# Patient Record
Sex: Male | Born: 1985 | Race: Black or African American | Hispanic: No | Marital: Single | Smoking: Never smoker
Health system: Southern US, Community
[De-identification: ages and names within clinical notes are randomized; demographics above are authoritative.]

## PROBLEM LIST (undated history)

## (undated) DIAGNOSIS — R011 Cardiac murmur, unspecified: Secondary | ICD-10-CM

## (undated) DIAGNOSIS — W3400XA Accidental discharge from unspecified firearms or gun, initial encounter: Secondary | ICD-10-CM

## (undated) DIAGNOSIS — R9439 Abnormal result of other cardiovascular function study: Secondary | ICD-10-CM

## (undated) DIAGNOSIS — F1210 Cannabis abuse, uncomplicated: Secondary | ICD-10-CM

## (undated) DIAGNOSIS — R55 Syncope and collapse: Secondary | ICD-10-CM

## (undated) DIAGNOSIS — I421 Obstructive hypertrophic cardiomyopathy: Secondary | ICD-10-CM

## (undated) HISTORY — DX: Abnormal result of other cardiovascular function study: R94.39

## (undated) HISTORY — DX: Obstructive hypertrophic cardiomyopathy: I42.1

---

## 2010-08-12 ENCOUNTER — Emergency Department (HOSPITAL_COMMUNITY)
Admission: EM | Admit: 2010-08-12 | Discharge: 2010-08-12 | Disposition: A | Discharge status: Home / Self Care | Payer: Self-pay | Attending: Emergency Medicine | Admitting: Emergency Medicine

## 2010-08-12 DIAGNOSIS — K089 Disorder of teeth and supporting structures, unspecified: Secondary | ICD-10-CM | POA: Insufficient documentation

## 2012-07-19 ENCOUNTER — Ambulatory Visit: Payer: Self-pay | Admitting: Family Medicine

## 2012-10-21 DIAGNOSIS — IMO0002 Reserved for concepts with insufficient information to code with codable children: Secondary | ICD-10-CM | POA: Insufficient documentation

## 2012-10-21 DIAGNOSIS — Y9389 Activity, other specified: Secondary | ICD-10-CM | POA: Insufficient documentation

## 2012-10-21 DIAGNOSIS — S6990XA Unspecified injury of unspecified wrist, hand and finger(s), initial encounter: Secondary | ICD-10-CM | POA: Insufficient documentation

## 2012-10-21 DIAGNOSIS — S99919A Unspecified injury of unspecified ankle, initial encounter: Secondary | ICD-10-CM | POA: Insufficient documentation

## 2012-10-21 DIAGNOSIS — S59909A Unspecified injury of unspecified elbow, initial encounter: Secondary | ICD-10-CM | POA: Insufficient documentation

## 2012-10-21 DIAGNOSIS — Y92410 Unspecified street and highway as the place of occurrence of the external cause: Secondary | ICD-10-CM | POA: Insufficient documentation

## 2012-10-21 DIAGNOSIS — S8990XA Unspecified injury of unspecified lower leg, initial encounter: Secondary | ICD-10-CM | POA: Insufficient documentation

## 2012-10-22 ENCOUNTER — Emergency Department (HOSPITAL_COMMUNITY)
Admission: EM | Admit: 2012-10-22 | Discharge: 2012-10-22 | Disposition: A | Discharge status: Home / Self Care | Payer: Medicaid Other | Attending: Emergency Medicine | Admitting: Emergency Medicine

## 2012-10-22 ENCOUNTER — Emergency Department (HOSPITAL_COMMUNITY): Payer: Medicaid Other

## 2012-10-22 ENCOUNTER — Encounter (HOSPITAL_COMMUNITY): Payer: Self-pay
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
## 2012-12-11 ENCOUNTER — Ambulatory Visit: Payer: Self-pay | Admitting: Family Medicine

## 2015-09-14 ENCOUNTER — Emergency Department (HOSPITAL_COMMUNITY)
Admission: EM | Admit: 2015-09-14 | Discharge: 2015-09-15 | Discharge status: Against Medical Advice | Payer: Medicaid Other | Source: Home / Self Care

## 2015-09-14 NOTE — ED Notes (Signed)
Pt chose to leave before triage. Pt encouraged to stay, pt still chose to leave.

## 2017-08-17 ENCOUNTER — Observation Stay: Payer: Medicaid Other

## 2017-08-17 ENCOUNTER — Other Ambulatory Visit: Payer: Self-pay

## 2017-08-17 ENCOUNTER — Inpatient Hospital Stay
Admission: EM | Admit: 2017-08-17 | Discharge: 2017-08-18 | DRG: 312 | Disposition: A | Discharge status: Home / Self Care | Payer: Medicaid Other | Attending: Family Medicine | Admitting: Family Medicine

## 2017-08-17 ENCOUNTER — Observation Stay (HOSPITAL_BASED_OUTPATIENT_CLINIC_OR_DEPARTMENT_OTHER)
Admit: 2017-08-17 | Discharge: 2017-08-17 | Disposition: A | Discharge status: Home / Self Care | Payer: Medicaid Other | Attending: Internal Medicine | Admitting: Internal Medicine

## 2017-08-17 ENCOUNTER — Emergency Department: Payer: Medicaid Other

## 2017-08-17 ENCOUNTER — Encounter: Payer: Self-pay | Admitting: Emergency Medicine

## 2017-08-17 DIAGNOSIS — I421 Obstructive hypertrophic cardiomyopathy: Secondary | ICD-10-CM | POA: Diagnosis present

## 2017-08-17 DIAGNOSIS — F1210 Cannabis abuse, uncomplicated: Secondary | ICD-10-CM | POA: Diagnosis present

## 2017-08-17 DIAGNOSIS — R55 Syncope and collapse: Secondary | ICD-10-CM

## 2017-08-17 DIAGNOSIS — R778 Other specified abnormalities of plasma proteins: Secondary | ICD-10-CM

## 2017-08-17 DIAGNOSIS — R7989 Other specified abnormal findings of blood chemistry: Secondary | ICD-10-CM

## 2017-08-17 DIAGNOSIS — R011 Cardiac murmur, unspecified: Secondary | ICD-10-CM | POA: Diagnosis present

## 2017-08-17 DIAGNOSIS — E876 Hypokalemia: Secondary | ICD-10-CM | POA: Diagnosis present

## 2017-08-17 HISTORY — DX: Cannabis abuse, uncomplicated: F12.10

## 2017-08-17 HISTORY — DX: Syncope and collapse: R55

## 2017-08-17 HISTORY — DX: Accidental discharge from unspecified firearms or gun, initial encounter: W34.00XA

## 2017-08-17 HISTORY — DX: Cardiac murmur, unspecified: R01.1

## 2017-08-17 LAB — BASIC METABOLIC PANEL
Anion gap: 6 (ref 5–15)
BUN: 13 mg/dL (ref 6–20)
CALCIUM: 9 mg/dL (ref 8.9–10.3)
CO2: 27 mmol/L (ref 22–32)
CREATININE: 1.12 mg/dL (ref 0.61–1.24)
Chloride: 104 mmol/L (ref 101–111)
GFR calc Af Amer: >60 mL/min (ref >60)
GFR calc non Af Amer: >60 mL/min (ref >60)
GLUCOSE: 96 mg/dL (ref 65–99)
Potassium: 3.3 mmol/L — ABNORMAL LOW (ref 3.5–5.1)
SODIUM: 137 mmol/L (ref 135–145)

## 2017-08-17 LAB — FIBRIN DERIVATIVES D-DIMER (ARMC ONLY): Fibrin derivatives D-dimer (ARMC): 205.77 ng/mL (FEU) (ref 0.00–499.00)

## 2017-08-17 LAB — ECHOCARDIOGRAM COMPLETE
Height: 72 in
Weight: 2832 oz

## 2017-08-17 LAB — CBC
HCT: 39 % — ABNORMAL LOW (ref 40.0–52.0)
Hemoglobin: 13.5 g/dL (ref 13.0–18.0)
MCH: 29.3 pg (ref 26.0–34.0)
MCHC: 34.5 g/dL (ref 32.0–36.0)
MCV: 85 fL (ref 80.0–100.0)
PLATELETS: 160 10*3/uL (ref 150–440)
RBC: 4.59 MIL/uL (ref 4.40–5.90)
RDW: 13.3 % (ref 11.5–14.5)
WBC: 7.2 10*3/uL (ref 3.8–10.6)

## 2017-08-17 LAB — TROPONIN I
TROPONIN I: 0.04 ng/mL — AB (ref <0.03)
TROPONIN I: 0.04 ng/mL — AB (ref <0.03)
Troponin I: 0.05 ng/mL (ref <0.03)

## 2017-08-17 MED ORDER — POTASSIUM CHLORIDE CRYS ER 10 MEQ PO TBCR
30.0000 meq | EXTENDED_RELEASE_TABLET | Freq: Once | ORAL | Status: AC
  Administered 2017-08-17: 30 meq via ORAL
  Filled 2017-08-17: qty 3

## 2017-08-17 MED ORDER — ADULT MULTIVITAMIN W/MINERALS CH
1.0000 | ORAL_TABLET | Freq: Every day | ORAL | Status: DC
  Administered 2017-08-17 – 2017-08-18 (×2): 1 via ORAL
  Filled 2017-08-17 (×2): qty 1

## 2017-08-17 MED ORDER — ONDANSETRON HCL 4 MG PO TABS: 4.0000 mg | ORAL_TABLET | Freq: Four times a day (QID) | ORAL | Status: DC | PRN

## 2017-08-17 MED ORDER — ONDANSETRON HCL 4 MG/2ML IJ SOLN: 4.0000 mg | Freq: Four times a day (QID) | INTRAMUSCULAR | Status: DC | PRN

## 2017-08-17 MED ORDER — SENNOSIDES-DOCUSATE SODIUM 8.6-50 MG PO TABS: 1.0000 | ORAL_TABLET | Freq: Every evening | ORAL | Status: DC | PRN

## 2017-08-17 MED ORDER — ENOXAPARIN SODIUM 40 MG/0.4ML ~~LOC~~ SOLN: 40.0000 mg | SUBCUTANEOUS | Status: DC

## 2017-08-17 MED ORDER — ACETAMINOPHEN 325 MG PO TABS: 650.0000 mg | ORAL_TABLET | Freq: Four times a day (QID) | ORAL | Status: DC | PRN

## 2017-08-17 MED ORDER — BOOST / RESOURCE BREEZE PO LIQD CUSTOM
1.0000 | Freq: Three times a day (TID) | ORAL | Status: DC
  Administered 2017-08-17 – 2017-08-18 (×3): 1 via ORAL

## 2017-08-17 MED ORDER — SODIUM CHLORIDE 0.9% FLUSH
3.0000 mL | Freq: Two times a day (BID) | INTRAVENOUS | Status: DC
  Administered 2017-08-17 (×2): 3 mL via INTRAVENOUS

## 2017-08-17 MED ORDER — METOPROLOL TARTRATE 25 MG PO TABS
12.5000 mg | ORAL_TABLET | Freq: Two times a day (BID) | ORAL | Status: DC
  Administered 2017-08-17 – 2017-08-18 (×3): 12.5 mg via ORAL
  Filled 2017-08-17 (×3): qty 1

## 2017-08-17 MED ORDER — NITROGLYCERIN 0.4 MG SL SUBL: 0.4000 mg | SUBLINGUAL_TABLET | SUBLINGUAL | Status: DC | PRN

## 2017-08-17 MED ORDER — ACETAMINOPHEN 650 MG RE SUPP: 650.0000 mg | Freq: Four times a day (QID) | RECTAL | Status: DC | PRN

## 2017-08-17 NOTE — ED Triage Notes (Signed)
Patient ambulatory to triage with steady gait, without difficulty or distress noted; pt reports [facility] "all the time"; denies any accomp symptoms, denies any recent illness

## 2017-08-17 NOTE — Plan of Care (Signed)
  Problem: Health Behavior/Discharge Planning: Goal: Ability to manage health-related needs will improve Outcome: Progressing   Problem: Clinical Measurements: Goal: Ability to maintain clinical measurements within normal limits will improve Outcome: Progressing Goal: Diagnostic test results will improve Outcome: Progressing Goal: Cardiovascular complication will be avoided Outcome: Progressing   Problem: Activity: Goal: Risk for activity intolerance will decrease Outcome: Progressing   

## 2017-08-17 NOTE — H&P (Signed)
[facility] -  at [facility]   PATIENT NAME: Kert Shackett    MR#:  [id number]  DATE OF BIRTH:  [birth year]  DATE OF ADMISSION:  08/17/2017  PRIMARY CARE PHYSICIAN: Patient, No Pcp Per   REQUESTING/REFERRING PHYSICIAN:  Merrily Brittle, MD   CHIEF COMPLAINT:  No chief complaint on file.  CC: Exertional syncope.  HISTORY OF PRESENT ILLNESS:  Yahir Tavano  is a 32 y.o. male with no known chronic medical conditions/PMHx who p/w exertional syncope. Pt states that the first time he had a syncopal episode was in 2009, at the age of 32 years old. He states he was playing basketball, and then took a break to run to his car to get water. He grabbed the handle to the car to open the door, and woke up on the ground next to the car. He states that he will typically experience diaphoresis, SOB and hyperventilation, tunnel vision, vision disturbance/blackout and transient hyperacusis in association with/preceding syncopal attacks. He states he is more likely to have a syncopal attack when it is hot outside or if he is breathing hot air, and breathing cold air helps to alleviate his symptoms. Since his initial episode, he states he has had exertional near-syncope and syncope on a regular ongoing basis, with episodes sometimes occurring daily, almost always associated with any activity that causes an increase in heart rate (though, interestingly, he notes he has never had a syncopal episode due to sex, which he states typically increases his heart rate significantly).  Mr. Odenthal tells me he recently started a new job ~2wks ago. 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 He states the work is moderately strenuous, as it requires lifting the motors, which weigh ~60lb by his estimation. He works the night shift, and starts his shift at 2100PM. He states he went to work on Tuesday  (08/16/2017) evening, and worked from [employer] w/o any major issue, though he states he began to feel short of breath around that time. He took a ten-minute break, and then resumed his responsibilities at 2320PM. Between 2320PM and midnight, he states his symptoms started to get worse. He characterizes his symptoms as the aforementioned prodrome that typically precedes his syncopal attacks (diaphoresis, SOB, hyperventilation, tunnel vision, hyperacusis). He did not lose consciousness though. He told his boss that he was not feeling well, and drove himself to the hospital. The pt endorses the aforementioned symptoms, as well as the sensation of occasional ectopic heartbeats, but he denies any other symptoms. (-) F/C/N/V/D/AP, CP, palpitations, night sweats, rigors, HA/blurred vision, vertigo,  urinary symptoms. He is young, otherwise healthy and well-appearing, though he does note that he has avoided exercise and strenuous activity for much of his life, as a result of his symptoms. He has not seen a PCP in 43yrs. He denies family history of sudden cardiac death.  PAST MEDICAL HISTORY:  History reviewed. No pertinent past medical history.  PAST SURGICAL HISTORY:  History reviewed. No pertinent surgical history.  SOCIAL HISTORY:   Social History   Tobacco Use  . Smoking status: Never Smoker  . Smokeless tobacco: Never Used  Substance Use Topics  . Alcohol use: Yes   (-) tobacco/EtOH use. (+) daily marijuana use. (-) cocaine/heroin use. Lives in house w/ significant other, 4 sons. Works for [employer].  FAMILY HISTORY:  History reviewed. No pertinent family history.  Mother alive & healthy @ age 34. Father deceased @ age 34, 2/2 ALS. 4 brothers (1 w/ DM, 3 healthy). 3 sisters (1 deceased, 2/2 Sz; 2 alive & healthy). 4 sons (1 w/ tympanostomy tubes; 1 w/ cryptorchidism).  DRUG ALLERGIES:  No Known Allergies  REVIEW OF SYSTEMS:   Review of Systems  Constitutional: Negative for chills,  diaphoresis, fever, malaise/fatigue and weight loss.  HENT: Negative for congestion, hearing loss, sinus pain, sore throat and tinnitus.   Eyes: Negative for blurred vision, double vision and photophobia.  Respiratory: Positive for shortness of breath. Negative for cough, hemoptysis and sputum production.   Cardiovascular: Negative for chest pain, palpitations, orthopnea, claudication, leg swelling and PND.  Gastrointestinal: Negative for abdominal pain, blood in stool, constipation, diarrhea, heartburn, melena, nausea and vomiting.  Genitourinary: Negative for dysuria, frequency, hematuria and urgency.  Musculoskeletal: Negative for back pain, joint pain, myalgias and neck pain.  Skin: Negative for itching and rash.  Neurological: Positive for dizziness, sensory change and loss of consciousness. Negative for tingling, tremors, speech change, focal weakness, seizures, weakness and headaches.    MEDICATIONS AT HOME:   Prior to Admission medications   Not on File      VITAL SIGNS:  Blood pressure 131/72, pulse (!) 59, temperature 98 F (36.7 C), temperature source Oral, resp. rate 14, height 6' (1.829 m), weight 85.7 kg (189 lb), SpO2 99 %.  PHYSICAL EXAMINATION:  Physical Exam  Constitutional: He is oriented to person, place, and time. He appears well-developed and well-nourished. He is active and cooperative. He is easily aroused. No distress.  HENT:  Head: Normocephalic and atraumatic.  Mouth/Throat: No oropharyngeal exudate.  Eyes: Pupils are equal, round, and reactive to light. EOM are normal. No scleral icterus.  Neck: Normal range of motion. Neck supple. No JVD present. No thyromegaly present.  Cardiovascular: Normal rate, regular rhythm, S1 normal and S2 normal.  No extrasystoles are present. Exam reveals no gallop, no S3, no S4, no distant heart sounds and no friction rub.  Murmur heard.  Systolic murmur is present with a grade of 4/6. Pulmonary/Chest: Effort normal and  breath sounds normal. No stridor. No respiratory distress. He has no decreased breath sounds. He has no wheezes. He has no rhonchi. He has no rales.  Abdominal: Soft. Bowel sounds are normal. He exhibits no distension. There is no tenderness. There is no rebound and no guarding.  Musculoskeletal: Normal range of motion. He exhibits no edema.  Lymphadenopathy:    He has no cervical adenopathy.  Neurological: He is alert, oriented to person, place, and time and easily aroused. He is not disoriented. He displays normal reflexes. No cranial nerve deficit or sensory deficit. He exhibits normal muscle tone. Coordination normal.  Skin: Skin is warm, dry and intact. No rash noted. He is not diaphoretic. No erythema.  Psychiatric: He has a normal mood and affect. His behavior is normal. Judgment and thought content normal.    LABORATORY PANEL:   CBC Recent Labs  Lab 08/17/17 0059  WBC 7.2  HGB 13.5  HCT 39.0*  PLT 160   ------------------------------------------------------------------------------------------------------------------  Chemistries  Recent Labs  Lab 08/17/17 0059  NA 137  K 3.3*  CL 104  CO2 27  GLUCOSE 96  BUN 13  CREATININE 1.12  CALCIUM 9.0   ------------------------------------------------------------------------------------------------------------------  Cardiac Enzymes Recent Labs  Lab 08/17/17 0059  TROPONINI 0.04*   ------------------------------------------------------------------------------------------------------------------  RADIOLOGY:  Dg Chest 2 View  Result Date: 08/17/2017 CLINICAL DATA:  Short of  breath EXAM: CHEST - 2 VIEW COMPARISON:  None. FINDINGS: The heart size and mediastinal contours are within normal limits. Both lungs are clear. The visualized skeletal structures are unremarkable. IMPRESSION: No active cardiopulmonary disease. Electronically Signed   By: Jasmine Pang M.D.   On: 08/17/2017 01:20   IMPRESSION AND PLAN:   A/P: 70M  exertional syncope/systolic murmur.  1.) Exertional syncope/systolic murmur: Pt p/w 42yr Hx exertional syncope. He recently started a new job, and had to leave work and come to the hospital early on Wednesday (08/17/2017) morning due to symptoms. Cardiac examination is (+) 4/6 systolic murmur. EKG does not demonstrate acute ST segment changes, and shows TWI in I, II, aVL, V5-V6, which seem to be stable changes. CXR (-). Trop-I unimpressive. 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 Pt ordered for Echocardiogram. Cardiology consult. We did briefly discuss myomectomy, ablation and conservative management of HOCM, just for fun, but we concluded with the agreement that diagnosis should be confirmed by testing prior to comprehensive discussion of management, and further recommendations would ideally be provided by one of our venerable expert Cardiologists. Tele, cardiac monitoring.  2.) Hypokalemia: K+ 3.3, mild. Replete, monitor.  3.) FEN/GI: Regular diet.  4.) DVT PPx: Lovenox 40mg  SQ qD.  5.) Code status: Full code.  6.) Disposition: Observation, pt expected to stay < 2 midnights.  All the records are reviewed and case discussed with ED provider. Management plans discussed with the patient, family and they are in agreement.  CODE STATUS: Full code.  TOTAL TIME TAKING CARE OF THIS PATIENT: 75 minutes.    Barbaraann Rondo M.D on 08/17/2017 at 4:51 AM  Between 7am to 6pm - Pager - [phone]  After 6pm go to [email or web address] - Social research officer, government  [facility]  Office  [phone]  CC: Primary care physician; Patient, No Pcp Per   Note: This dictation was prepared with Dragon dictation along with smaller phrase technology. Any transcriptional errors that result from this process are unintentional.

## 2017-08-17 NOTE — ED Provider Notes (Signed)
[facility] Emergency Department Provider Note  ____________________________________________   First MD Initiated Contact with Patient 08/17/17 0245     (approximate)  I have reviewed the triage vital signs and the nursing notes.   HISTORY  Chief Complaint No chief complaint on file.   HPI Derek Price is a 32 y.o. male who self presents to the emergency department with exertional syncope.  He has no past medical history and takes no medications.  He has no family history of sudden cardiac death.  He says that for the past month or so when he is exerting himself he is gotten "out of breath" and has passed out multiple times.  He did not actually pass out completely today but while at work he nearly did which prompted the visit.  He denies palpitations.  He denies chest pain.  His symptoms are intermittent sudden onset.  They are improved with rest.  He does report smoking marijuana but denies other drug use.  History reviewed. No pertinent past medical history.  There are no active problems to display for this patient.   History reviewed. No pertinent surgical history.  Prior to Admission medications   Not on File    Allergies Patient has no known allergies.  No family history on file.  Social History Social History   Tobacco Use  . Smoking status: Never Smoker  . Smokeless tobacco: Never Used  Substance Use Topics  . Alcohol use: Yes  . Drug use: Yes    Types: Marijuana    Review of Systems Constitutional: No fever/chills Eyes: No visual changes. ENT: No sore throat. Cardiovascular: Denies chest pain. Respiratory: Positive for shortness of breath. Gastrointestinal: No abdominal pain.  No nausea, no vomiting.  No diarrhea.  No constipation. Genitourinary: Negative for dysuria. Musculoskeletal: Negative for back pain. Skin: Negative for rash. Neurological: Negative for headaches, focal weakness or  numbness.   ____________________________________________   PHYSICAL EXAM:  VITAL SIGNS: ED Triage Vitals  Enc Vitals Group     BP 08/17/17 0049 (!) 141/73     Pulse Rate 08/17/17 0049 60     Resp 08/17/17 0049 20     Temp 08/17/17 0049 98 F (36.7 C)     Temp Source 08/17/17 0049 Oral     SpO2 08/17/17 0049 99 %     Weight 08/17/17 0048 189 lb (85.7 kg)     Height 08/17/17 0048 6' (1.829 m)     Head Circumference --      Peak Flow --      Pain Score 08/17/17 0048 0     Pain Loc --      Pain Edu? --      Excl. in GC? --     Constitutional: Alert and oriented x4 pleasant cooperative speaks in full clear sentences no diaphoresis Eyes: PERRL EOMI. Head: Atraumatic. Nose: No congestion/rhinnorhea. Mouth/Throat: No trismus Neck: No stridor.  Able to lie completely flat with no JVD Cardiovascular: Normal rate, regular rhythm.  Harsh systolic murmur best heard at right sternal border Respiratory: Normal respiratory effort.  No retractions. Lungs CTAB and moving good air Gastrointestinal: Soft nontender Musculoskeletal: No lower extremity edema legs are equal in size Neurologic:  Normal speech and language. No gross focal neurologic deficits are appreciated. Skin:  Skin is warm, dry and intact. No rash noted. Psychiatric: Mood and affect are normal. Speech and behavior are normal.    ____________________________________________   DIFFERENTIAL includes but not limited to  Cardiogenic syncope,  vasovagal syncope, dehydration, acute coronary syndrome, pulmonary embolism ____________________________________________   LABS (all labs ordered are listed, but only abnormal results are displayed)  Labs Reviewed  CBC - Abnormal; Notable for the following components:      Result Value   HCT 39.0 (*)    All other components within normal limits  BASIC METABOLIC PANEL - Abnormal; Notable for the following components:   Potassium 3.3 (*)    All other components within normal  limits  TROPONIN I - Abnormal; Notable for the following components:   Troponin I 0.04 (*)    All other components within normal limits    Lab work reviewed by me shows slightly elevated troponin which is nonspecific __________________________________________  EKG  ED ECG REPORT I, Merrily BrittleNeil Moana Munford, the attending physician, personally viewed and interpreted this ECG.  Date: 08/17/2017 EKG Time: 0054 Rate: 62 Rhythm: normal sinus rhythm QRS Axis: normal Intervals: normal ST/T Wave abnormalities: TWI leads I, II, aVL, V6 possibly secondary to LVH versus subendocardial ischemia Narrative Interpretation:possible ischemia  ED ECG REPORT I, Merrily BrittleNeil Briseida Gittings, the attending physician, personally viewed and interpreted this ECG.  Date: 08/17/2017 EKG Time: 0256 Rate: 67 Rhythm: normal sinus rhythm QRS Axis: normal Intervals: normal ST/T Wave abnormalities: T wave inversion in lead I, aVL, aVF, V5 V6 similar to previous Narrative Interpretation: no evidence of acute ischemia  ____________________________________________  RADIOLOGY  Chest x-ray reviewed by me with no acute disease ____________________________________________   PROCEDURES  Procedure(s) performed: no  Procedures  Critical Care performed: no  Observation: no ____________________________________________   INITIAL IMPRESSION / ASSESSMENT AND PLAN / ED COURSE  Pertinent labs & imaging results that were available during my care of the patient were reviewed by me and considered in my medical decision making (see chart for details).       ----------------------------------------- 2:57 AM on 08/17/2017 -----------------------------------------  The patient has had increasing frequency of exertional syncope along with a harsh systolic murmur concerning for aortic stenosis versus HOCM.  At this point the patient requires inpatient admission for continued telemetry to evaluate for malignant arrhythmia as well  as echocardiogram.  The patient verbalizes understanding and agreement with the plan.  I then discussed with the hospitalist who has graciously agreed to admit the patient to his service. ____________________________________________   FINAL CLINICAL IMPRESSION(S) / ED DIAGNOSES  Final diagnoses:  None      NEW MEDICATIONS STARTED DURING THIS VISIT:  New Prescriptions   No medications on file     Note:  This document was prepared using Dragon voice recognition software and may include unintentional dictation errors.     Merrily Brittleifenbark, Cheyenne Schumm, MD 08/17/17 [phone]
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
## 2017-08-17 NOTE — Progress Notes (Signed)
To ultrasound via bed 

## 2017-08-17 NOTE — Progress Notes (Signed)
Back from ultrasound

## 2017-08-17 NOTE — Progress Notes (Addendum)
Initial Nutrition Assessment  DOCUMENTATION CODES:   Not applicable  INTERVENTION:   Boost Breeze po TID, each supplement provides 250 kcal and 9 grams of protein  MVI daily  NUTRITION DIAGNOSIS:   Inadequate oral intake related to social / environmental circumstances(marijuna abuse ) as evidenced by per patient/family report.  GOAL:   Patient will meet greater than or equal to 90% of their needs  MONITOR:   PO intake, Supplement acceptance, Labs, Weight trends, I & O's  REASON FOR ASSESSMENT:   Malnutrition Screening Tool    ASSESSMENT:   32 year old man with history of recurrent syncope and near syncope in the setting of long-standing heart murmur, whom we have been asked to see due to near syncope.   Met with pt in room today. Pt reports poor appetite and oral intake for several months that he relates to a chronic poor appetite. Pt reports that he smokes marijuana daily and eats one meal a day. Pt states that he works third shift and eats one meal a day before he goes to work and then goes straight to bed once he gets off. Pt reports recent wt loss; pt states that he has never weighed less than 190lbs but he is currently 177lbs which was taken on a standing scale. Pt reports the last time he remembers weighing over 190lbs was 6 months ago. Pt does not like "milky supplements" but he does like Boost Breeze (mixed berry or orange). RD discussed with pt ways to increase calorie and protein intake during the day and recommended daily supplements. Also recommended daily MVI as long as his intake is poor. Pt at moderate refeeding risk and complaining of muscle weakness. Would recommend check Mg and P labs. Pt eating 30-70% of meals in hospital.     Medications reviewed and include: lovenox  Labs reviewed: K 3.3(L)  NUTRITION - FOCUSED PHYSICAL EXAM:    Most Recent Value  Orbital Region  No depletion  Upper Arm Region  No depletion  Thoracic and Lumbar Region  No depletion   Buccal Region  No depletion  Temple Region  No depletion  Clavicle Bone Region  No depletion  Clavicle and Acromion Bone Region  No depletion  Scapular Bone Region  No depletion  Dorsal Hand  No depletion  Patellar Region  Mild depletion  Anterior Thigh Region  Mild depletion  Posterior Calf Region  Mild depletion  Edema (RD Assessment)  None  Hair  Reviewed  Eyes  Reviewed  Mouth  Reviewed  Skin  Reviewed  Nails  Reviewed     Diet Order:  Diet regular Room service appropriate? Yes; Fluid consistency: Thin  EDUCATION NEEDS:   Education needs have been addressed  Skin:  Skin Assessment: Reviewed RN Assessment  Last BM:  4/17  Height:   Ht Readings from Last 1 Encounters:  08/17/17 6' (1.829 m)    Weight:   Wt Readings from Last 1 Encounters:  08/17/17 177 lb (80.3 kg)    Ideal Body Weight:  80.9 kg  BMI:  Body mass index is 24.01 kg/m.  Estimated Nutritional Needs:   Kcal:  2100-2400kcal/day   Protein:  89-104g/day   Fluid:  >2.2L/day  Koleen Distance MS, RD, LDN Pager #[phone] After Hours Pager: [phone]

## 2017-08-17 NOTE — ED Notes (Signed)
Pt provided with warm blankets and admission process explained to pt who verbalizes understanding.

## 2017-08-17 NOTE — Plan of Care (Signed)
  Problem: Clinical Measurements: Goal: Will remain free from infection Outcome: Progressing Note:  Remains afebrile    Problem: Pain Managment: Goal: General experience of comfort will improve Outcome: Progressing Note:  PRN medications   Problem: Safety: Goal: Ability to remain free from injury will improve Outcome: Progressing Note:  Fall precautions in place, non skid socks when oob   Problem: Clinical Measurements: Goal: Diagnostic test results will improve Outcome: Not Progressing Note:  K 3.3 this am, replacing

## 2017-08-17 NOTE — Progress Notes (Signed)
*  PRELIMINARY RESULTS* Echocardiogram 2D Echocardiogram has been performed.  Derek Price, Derek Price 08/17/2017, 2:10 PM

## 2017-08-17 NOTE — ED Notes (Signed)
Patient transported to 235 

## 2017-08-17 NOTE — Progress Notes (Addendum)
[facility] [place] [facility]   PATIENT NAME: Derek Price    MR#:  [id number]  DATE OF BIRTH:  [birth year]  SUBJECTIVE:  CHIEF COMPLAINT:  No chief complaint on file. Patient without complaint, no events overnight  REVIEW OF SYSTEMS:  CONSTITUTIONAL: No fever, fatigue or weakness.  EYES: No blurred or double vision.  EARS, NOSE, AND THROAT: No tinnitus or ear pain.  RESPIRATORY: No cough, shortness of breath, wheezing or hemoptysis.  CARDIOVASCULAR: No chest pain, orthopnea, edema.  GASTROINTESTINAL: No nausea, vomiting, diarrhea or abdominal pain.  GENITOURINARY: No dysuria, hematuria.  ENDOCRINE: No polyuria, nocturia,  HEMATOLOGY: No anemia, easy bruising or bleeding SKIN: No rash or lesion. MUSCULOSKELETAL: No joint pain or arthritis.   NEUROLOGIC: No tingling, numbness, weakness.  PSYCHIATRY: No anxiety or depression.   ROS  DRUG ALLERGIES:  No Known Allergies  VITALS:  Blood pressure 106/74, pulse (!) 50, temperature 97.7 F (36.5 C), temperature source Oral, resp. rate 18, height 6' (1.829 m), weight 80.3 kg (177 lb), SpO2 99 %.  PHYSICAL EXAMINATION:  GENERAL:  32 y.o.-year-old patient lying in the bed with no acute distress.  EYES: Pupils equal, round, reactive to light and accommodation. No scleral icterus. Extraocular muscles intact.  HEENT: Head atraumatic, normocephalic. Oropharynx and nasopharynx clear.  NECK:  Supple, no jugular venous distention. No thyroid enlargement, no tenderness.  LUNGS: Normal breath sounds bilaterally, no wheezing, rales,rhonchi or crepitation. No use of accessory muscles of respiration.  CARDIOVASCULAR: S1, S2 normal. No murmurs, rubs, or gallops.  ABDOMEN: Soft, nontender, nondistended. Bowel sounds present. No organomegaly or mass.  EXTREMITIES: No pedal edema, cyanosis, or clubbing.  NEUROLOGIC: Cranial nerves II through XII are intact. Muscle strength 5/5 in all extremities. Sensation intact. Gait not  checked.  PSYCHIATRIC: The patient is alert and oriented x 3.  SKIN: No obvious rash, lesion, or ulcer.   Physical Exam LABORATORY PANEL:   CBC Recent Labs  Lab 08/17/17 0059  WBC 7.2  HGB 13.5  HCT 39.0*  PLT 160   ------------------------------------------------------------------------------------------------------------------  Chemistries  Recent Labs  Lab 08/17/17 0059  NA 137  K 3.3*  CL 104  CO2 27  GLUCOSE 96  BUN 13  CREATININE 1.12  CALCIUM 9.0   ------------------------------------------------------------------------------------------------------------------  Cardiac Enzymes Recent Labs  Lab 08/17/17 0059  TROPONINI 0.04*   ------------------------------------------------------------------------------------------------------------------  RADIOLOGY:  Dg Chest 2 View  Result Date: 08/17/2017 CLINICAL DATA:  Short of breath EXAM: CHEST - 2 VIEW COMPARISON:  None. FINDINGS: The heart size and mediastinal contours are within normal limits. Both lungs are clear. The visualized skeletal structures are unremarkable. IMPRESSION: No active cardiopulmonary disease. Electronically Signed   By: Jasmine PangKim  Fujinaga M.D.   On: 08/17/2017 01:20   [place] Carotid Bilateral  Result Date: 08/17/2017 CLINICAL DATA:  32 year old male with a history of syncope. Cardiovascular risk factors include none listed EXAM: BILATERAL CAROTID DUPLEX ULTRASOUND TECHNIQUE: Wallace CullensGray scale imaging, color Doppler and duplex ultrasound were performed of bilateral carotid and vertebral arteries in the neck. COMPARISON:  None. FINDINGS: Criteria: Quantification of carotid stenosis is based on velocity parameters that correlate the residual internal carotid diameter with NASCET-based stenosis levels, using the diameter of the distal internal carotid lumen as the denominator for stenosis measurement. The following velocity measurements were obtained: RIGHT ICA:  Systolic 121 cm/sec, Diastolic 28 cm/sec CCA:  193  cm/sec SYSTOLIC ICA/CCA RATIO:  0.6 ECA:  90 cm/sec LEFT ICA:  Systolic 114 cm/sec, Diastolic 50 cm/sec CCA:  185  cm/sec SYSTOLIC ICA/CCA RATIO:  0.6 ECA:  111 cm/sec Right Brachial SBP: Not acquired Left Brachial SBP: Not acquired RIGHT CAROTID ARTERY: No significant calcified disease of the right common carotid artery. Intermediate waveform maintained. Heterogeneous plaque without significant calcifications at the right carotid bifurcation. Low resistance waveform of the right ICA. No significant tortuosity. RIGHT VERTEBRAL ARTERY: Antegrade flow with low resistance waveform. LEFT CAROTID ARTERY: No significant calcified disease of the left common carotid artery. Intermediate waveform maintained. Heterogeneous plaque at the left carotid bifurcation without significant calcifications. Low resistance waveform of the left ICA. LEFT VERTEBRAL ARTERY:  Antegrade flow with low resistance waveform. IMPRESSION: Color duplex indicates minimal heterogeneous plaque, with no hemodynamically significant stenosis by duplex criteria in the extracranial cerebrovascular circulation. Signed, Yvone Neu. Loreta Ave, DO [facility] Electronically Signed   By: Gilmer Mor D.O.   On: 08/17/2017 10:08    ASSESSMENT AND PLAN:  A/P: 22M exertional syncope/systolic murmur.  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 Follow-up on echocardiogram, EKG noted for marked atrial enlargement, noted stage 5 out of 6 systolic ejection murmur on examination, cardiology input appreciated-potential cardiac MRI/EP studies for ICD/referral to tertiary center for a myomectomy versus septal ablation evaluation, carotid Dopplers were negative, orthostatics were negative  *Acute hypokalemia Repleted  *Acute on chronic illicit drug abuse Cessation counseling given  *Acute elevated troponins Acute coronary syndrome is doubted Cycle set of cardiac enzymes while in  house  *Acute abnormal EKG Noted for marked atrial enlargement  D-dimer was normal, follow-up on echocardiogram, and continue close medical monitoring, cardiology following   All the records are reviewed and case discussed with Care Management/Social Workerr. Management plans discussed with the patient, family and they are in agreement.  CODE STATUS: full  TOTAL TIME TAKING CARE OF THIS PATIENT: 35 minutes.     POSSIBLE D/C IN 1-2 DAYS, DEPENDING ON CLINICAL CONDITION.   Evelena Asa Raela Bohl M.D on 08/17/2017   Between 7am to 6pm - Pager - [phone]  After 6pm go to [email or web address] - password [email or web address]  [facility]  Office  [phone]  CC: Primary care physician; Patient, No Pcp Per  Note: This dictation was prepared with Dragon dictation along with smaller phrase technology. Any transcriptional errors that result from this process are unintentional.

## 2017-08-18 ENCOUNTER — Other Ambulatory Visit: Payer: Self-pay | Admitting: Nurse Practitioner

## 2017-08-18 DIAGNOSIS — I421 Obstructive hypertrophic cardiomyopathy: Secondary | ICD-10-CM

## 2017-08-18 DIAGNOSIS — E876 Hypokalemia: Secondary | ICD-10-CM | POA: Diagnosis present

## 2017-08-18 DIAGNOSIS — R55 Syncope and collapse: Secondary | ICD-10-CM | POA: Diagnosis not present

## 2017-08-18 DIAGNOSIS — F1210 Cannabis abuse, uncomplicated: Secondary | ICD-10-CM | POA: Diagnosis present

## 2017-08-18 DIAGNOSIS — R011 Cardiac murmur, unspecified: Secondary | ICD-10-CM | POA: Diagnosis present

## 2017-08-18 LAB — BASIC METABOLIC PANEL
Anion gap: 3 — ABNORMAL LOW (ref 5–15)
BUN: 13 mg/dL (ref 6–20)
CHLORIDE: 109 mmol/L (ref 101–111)
CO2: 26 mmol/L (ref 22–32)
Calcium: 9.1 mg/dL (ref 8.9–10.3)
Creatinine, Ser: 0.9 mg/dL (ref 0.61–1.24)
GFR calc non Af Amer: >60 mL/min (ref >60)
Glucose, Bld: 96 mg/dL (ref 65–99)
POTASSIUM: 4.1 mmol/L (ref 3.5–5.1)
SODIUM: 138 mmol/L (ref 135–145)

## 2017-08-18 LAB — GLUCOSE, CAPILLARY: Glucose-Capillary: 109 mg/dL — ABNORMAL HIGH (ref 65–99)

## 2017-08-18 LAB — HIV ANTIBODY (ROUTINE TESTING W REFLEX): HIV SCREEN 4TH GENERATION: NONREACTIVE

## 2017-08-18 LAB — PHOSPHORUS: PHOSPHORUS: 3.6 mg/dL (ref 2.5–4.6)

## 2017-08-18 LAB — TROPONIN I: TROPONIN I: 0.04 ng/mL — AB (ref <0.03)

## 2017-08-18 LAB — MAGNESIUM: MAGNESIUM: 1.8 mg/dL (ref 1.7–2.4)

## 2017-08-18 MED ORDER — METOPROLOL TARTRATE 25 MG PO TABS: 12.5000 mg | ORAL_TABLET | Freq: Two times a day (BID) | ORAL | 0 refills | Status: DC
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
## 2017-08-18 NOTE — Progress Notes (Signed)
Pt discharged to home. Heart monitor removed. Discharge instructions and education reviewed with pt. Pt ambulated to POV.

## 2017-08-18 NOTE — Progress Notes (Addendum)
Progress Note  Patient Name: Derek Price Date of Encounter: 08/18/2017  Primary Cardiologist: Yvonne Kendallhristopher End, MD  Subjective   No chest pain, sob, palpitations, presyncope, syncope.  Inpatient Medications    Scheduled Meds: . enoxaparin (LOVENOX) injection  40 mg Subcutaneous Q24H  . feeding supplement  1 Container Oral TID BM  . metoprolol tartrate  12.5 mg Oral BID  . multivitamin with minerals  1 tablet Oral Daily  . sodium chloride flush  3 mL Intravenous Q12H   Continuous Infusions:  PRN Meds: acetaminophen **OR** acetaminophen, nitroGLYCERIN, ondansetron **OR** ondansetron (ZOFRAN) IV, senna-docusate   Vital Signs    Vitals:   08/17/17 1918 08/17/17 2115 08/18/17 0344 08/18/17 0938  BP: 127/77  (!) 99/59 118/81  Pulse: (!) 48 (!) 59 (!) 53 (!) 54  Resp: 18  18   Temp: 98 F (36.7 C)  97.9 F (36.6 C)   TempSrc: Oral  Oral   SpO2: 100%  100%   Weight:   172 lb 12.8 oz (78.4 kg)   Height:        Intake/Output Summary (Last 24 hours) at 08/18/2017 1003 Last data filed at 08/18/2017 0500 Gross per 24 hour  Intake 243 ml  Output 0 ml  Net 243 ml   Filed Weights   08/17/17 0048 08/17/17 0518 08/18/17 0344  Weight: 189 lb (85.7 kg) 177 lb (80.3 kg) 172 lb 12.8 oz (78.4 kg)    Physical Exam   GEN: Well nourished, well developed, in no acute distress.  HEENT: Grossly normal.  Neck: Supple, no JVD, carotid bruits, or masses. Cardiac: RRR, 3/6 syst murmur @ llsb  apex, no rubs, or gallops. No clubbing, cyanosis, edema.  Radials/DP/PT 2+ and equal bilaterally.  Respiratory:  Respirations regular and unlabored, clear to auscultation bilaterally. GI: Soft, nontender, nondistended, BS + x 4. MS: no deformity or atrophy. Skin: warm and dry, no rash. Neuro:  Strength and sensation are intact. Psych: AAOx3.  Normal affect.  Labs    Chemistry Recent Labs  Lab 08/17/17 0059 08/18/17 0006  NA 137 138  K 3.3* 4.1  CL 104 109  CO2 27 26  GLUCOSE 96  96  BUN 13 13  CREATININE 1.12 0.90  CALCIUM 9.0 9.1  GFRNONAA >60 >60  GFRAA >60 >60  ANIONGAP 6 3*     Hematology Recent Labs  Lab 08/17/17 0059  WBC 7.2  RBC 4.59  HGB 13.5  HCT 39.0*  MCV 85.0  MCH 29.3  MCHC 34.5  RDW 13.3  PLT 160    Cardiac Enzymes Recent Labs  Lab 08/17/17 0059 08/17/17 1133 08/17/17 1906 08/18/17 0006  TROPONINI 0.04* 0.05* 0.04* 0.04*     Radiology    Dg Chest 2 View  Result Date: 08/17/2017 CLINICAL DATA:  Short of breath EXAM: CHEST - 2 VIEW COMPARISON:  None. FINDINGS: The heart size and mediastinal contours are within normal limits. Both lungs are clear. The visualized skeletal structures are unremarkable. IMPRESSION: No active cardiopulmonary disease. Electronically Signed   By: Jasmine PangKim  Fujinaga M.D.   On: 08/17/2017 01:20   [place] Carotid Bilateral  Result Date: 08/17/2017 CLINICAL DATA:  32 year old male with a history of syncope. Cardiovascular risk factors include none listed EXAM: BILATERAL CAROTID DUPLEX ULTRASOUND TECHNIQUE: Wallace CullensGray scale imaging, color Doppler and duplex ultrasound were performed of bilateral carotid and vertebral arteries in the neck. COMPARISON:  None. FINDINGS: Criteria: Quantification of carotid stenosis is based on velocity parameters that correlate the residual internal carotid diameter  with NASCET-based stenosis levels, using the diameter of the distal internal carotid lumen as the denominator for stenosis measurement. The following velocity measurements were obtained: RIGHT ICA:  Systolic 121 cm/sec, Diastolic 28 cm/sec CCA:  193 cm/sec SYSTOLIC ICA/CCA RATIO:  0.6 ECA:  90 cm/sec LEFT ICA:  Systolic 114 cm/sec, Diastolic 50 cm/sec CCA:  185 cm/sec SYSTOLIC ICA/CCA RATIO:  0.6 ECA:  111 cm/sec Right Brachial SBP: Not acquired Left Brachial SBP: Not acquired RIGHT CAROTID ARTERY: No significant calcified disease of the right common carotid artery. Intermediate waveform maintained. Heterogeneous plaque without significant  calcifications at the right carotid bifurcation. Low resistance waveform of the right ICA. No significant tortuosity. RIGHT VERTEBRAL ARTERY: Antegrade flow with low resistance waveform. LEFT CAROTID ARTERY: No significant calcified disease of the left common carotid artery. Intermediate waveform maintained. Heterogeneous plaque at the left carotid bifurcation without significant calcifications. Low resistance waveform of the left ICA. LEFT VERTEBRAL ARTERY:  Antegrade flow with low resistance waveform. IMPRESSION: Color duplex indicates minimal heterogeneous plaque, with no hemodynamically significant stenosis by duplex criteria in the extracranial cerebrovascular circulation. Signed, Yvone Neu. Loreta Ave, DO [facility] Radiology Electronically Signed   By: Gilmer Mor D.O.   On: 08/17/2017 10:08    Telemetry    Sinus brady - Personally Reviewed  Cardiac Studies   2D Echocardiogram 4.17.2019  Study Conclusions   - Left ventricle: The cavity size was normal. Wall thickness was   increased in a pattern of severe LVH. There was asymmetric   hypertrophy of the septum. Systolic function was vigorous. The   estimated ejection fraction was in the range of 65% to 70%. There   was dynamic obstruction in the outflow tract, with a peak   velocity of 379 cm/sec and a peak gradient of 57 mm Hg. Doppler   parameters are consistent with abnormal left ventricular   relaxation (grade 1 diastolic dysfunction). - Mitral valve: There was systolic anterior motion. There was mild   regurgitation. - Left atrium: The atrium was moderately to severely dilated. - Right ventricle: The cavity size was mildly dilated. Systolic   function was normal. - Right atrium: The atrium was mildly dilated. - Pericardium, extracardiac: A trivial pericardial effusion was   identified posterior to the heart.   Patient Profile     32 y.o. male with a history of murmur, syncope,  and marijuana abuse, who was admitted 4/17 due to recurrent syncope and found to have HOCM.  Assessment & Plan    1.  HOCM/Presyncope/Syncope: Pt w/ 10 year h/o predominantly exertional presyncope and syncope, presented 4/17 due to recurrent Ss @ work while lifting boxes.  Systolic murmur on exam and exho performed and confirmed HOCM w/ peak LVOT gradient of . Only mild MR.  No recurrent symptoms.  No events on tele.  Discussed outpt w/u with Dr. Graciela Husbands.  In the absence of events on tele here, we will not plan on additional monitoring as an outpt.  I will arrange for a stress exercise tolerance test in our office to assess hemodynamic response.  Will need to be very careful given presyncope/syncope history.  I will work with our [facility] team to arrange for cardiac MRI to assess for LGE and need for ICD and probable referral for septal reduction therapy @ [facility].  We will aslo arrange for genetic counseling and screening for he and first degree relatives with Dr. Jomarie Longs in our [facility] office.  Cont  blocker therapy.  Bradycardia limits titration.  Might consider dipyridamole as outpt.  We discussed importance of adequate hydration and avoidance of exertion given that this is the driver of symptoms.  No driving.  This will impact his work life and he will require documentation on our end.   Signed, Nicolasa Ducking, NP  08/18/2017, 10:03 AM    For questions or updates, please contact   Please consult [email or web address] for contact info under Cardiology/STEMI.

## 2017-08-18 NOTE — Progress Notes (Signed)
Patient insisted IV be removed. He stated no one has used it and he does not need it.  This RN explained the need to have an IV In place, however patient stated that he refused to keep it in. IV was removed per patient wish.

## 2017-08-19 ENCOUNTER — Other Ambulatory Visit: Payer: Self-pay | Admitting: *Deleted

## 2017-08-19 DIAGNOSIS — I421 Obstructive hypertrophic cardiomyopathy: Secondary | ICD-10-CM

## 2017-08-19 NOTE — Progress Notes (Addendum)
Error

## 2017-08-22 ENCOUNTER — Telehealth: Payer: Self-pay

## 2017-08-22 NOTE — Telephone Encounter (Signed)
Patient returned call.  He mentioned he has read over his discharge paperwork and still has them.  He did have a question regarding temporary assistance as he was told by his job that he cannot return until he is cleared.  [place] [facility] given to patient.   He does have a follow up appointment with Cardiology next week.   No other questions or concerns at this time.  I thanked him for his time and informed him that he would receive one more automated call checking on him in the next few days.

## 2017-08-22 NOTE — Telephone Encounter (Signed)
Flagged on EMMI report for not reading discharge papers.  1st attempt to reach patient made 08/22/17 at 1:40pm, though unavailable.  Left message encouraging callback.  Will attempt at later time.

## 2017-08-24 ENCOUNTER — Telehealth: Payer: Self-pay | Admitting: Nurse Practitioner

## 2017-08-24 ENCOUNTER — Encounter: Payer: Self-pay | Admitting: Nurse Practitioner

## 2017-08-24 NOTE — Telephone Encounter (Signed)
Called patient and gave him the date, time and location of cardiac MRI.  Calendar and letter regarding this sent to the patient.  Message sent to nurse.

## 2017-08-31 ENCOUNTER — Telehealth: Payer: Self-pay | Admitting: Nurse Practitioner

## 2017-08-31 NOTE — Telephone Encounter (Signed)
I called the patient to confirm that he is coming for his stress echo tomorrow morning at 7:30 am.   The patient confirms he will be here- instructions given as below  - you may eat a light breakfast/ lunch prior to your procedure - no caffeine for 24 hours prior to your test (coffee, tea, soft drinks, or chocolate)  - no smoking/ vaping for 4 hours prior to your test- patient states he only uses marijuana- advised no use the rest to the day and tomorrow morning - you may take your regular medications the day of your test except for: DO NOT take metoprolol tonight & in the morning (he has already had his AM dose today - wear comfortable clothing & tennis/ non-skid shoes to walk on the treadmill  The patient verbalizes understanding of the above as well as the address/ location of the test.

## 2017-09-01 ENCOUNTER — Ambulatory Visit (INDEPENDENT_AMBULATORY_CARE_PROVIDER_SITE_OTHER): Payer: Medicaid Other

## 2017-09-01 ENCOUNTER — Ambulatory Visit: Payer: Medicaid Other | Admitting: Nurse Practitioner

## 2017-09-01 ENCOUNTER — Other Ambulatory Visit: Payer: Self-pay

## 2017-09-01 DIAGNOSIS — I421 Obstructive hypertrophic cardiomyopathy: Secondary | ICD-10-CM

## 2017-09-01 DIAGNOSIS — R55 Syncope and collapse: Secondary | ICD-10-CM | POA: Diagnosis not present

## 2017-09-01 LAB — ECHOCARDIOGRAM STRESS TEST
CHL CUP MPHR: 186 {beats}/min
CHL CUP RESTING HR STRESS: 58 {beats}/min
CSEPEW: 13.4 METS
CSEPPHR: 157 {beats}/min
Exercise duration (min): 11 min
Exercise duration (sec): 19 s
Percent HR: 84 %
RPE: 16

## 2017-09-05 ENCOUNTER — Ambulatory Visit (HOSPITAL_COMMUNITY)
Admission: RE | Admit: 2017-09-05 | Discharge: 2017-09-05 | Disposition: A | Discharge status: Home / Self Care | Payer: Medicaid Other | Source: Ambulatory Visit | Attending: Nurse Practitioner | Admitting: Nurse Practitioner

## 2017-09-05 DIAGNOSIS — I421 Obstructive hypertrophic cardiomyopathy: Secondary | ICD-10-CM | POA: Insufficient documentation

## 2017-09-05 DIAGNOSIS — I517 Cardiomegaly: Secondary | ICD-10-CM | POA: Diagnosis not present

## 2017-09-05 MED ORDER — GADOBENATE DIMEGLUMINE 529 MG/ML IV SOLN
30.0000 mL | Freq: Once | INTRAVENOUS | Status: AC
  Administered 2017-09-05: 30 mL via INTRAVENOUS

## 2017-09-08 ENCOUNTER — Encounter: Payer: Self-pay | Admitting: Internal Medicine

## 2017-09-08 ENCOUNTER — Ambulatory Visit (INDEPENDENT_AMBULATORY_CARE_PROVIDER_SITE_OTHER): Payer: Medicaid Other | Admitting: Internal Medicine

## 2017-09-08 VITALS — BP 110/56 | HR 70 | Ht 72.0 in | Wt 184.0 lb

## 2017-09-08 DIAGNOSIS — I421 Obstructive hypertrophic cardiomyopathy: Secondary | ICD-10-CM | POA: Diagnosis not present

## 2017-09-08 MED ORDER — BISOPROLOL FUMARATE 5 MG PO TABS: 2.5000 mg | ORAL_TABLET | Freq: Every day | ORAL | 6 refills | Status: DC

## 2017-09-08 NOTE — Patient Instructions (Signed)
Medication Instructions: - Your physician has recommended you make the following change in your medication:   1) STOP metoprolol tartrate 2) START bisoprolol 5 mg- take 1/2 tablet (2.5mg ) by mouth once daily  Labwork: - none ordered  Procedures/Testing: - none ordered  Follow-Up: - Your physician recommends that you schedule a follow-up appointment in: 3 weeks with Ward Givens, NP  - Your physician recommends that you schedule a follow-up appointment in: 6 weeks with Dr. Graciela Husbands.   Any Additional Special Instructions Will Be Listed Below (If Applicable).     If you need a refill on your cardiac medications before your next appointment, please call your pharmacy.
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
## 2017-09-12 ENCOUNTER — Telehealth: Payer: Self-pay | Admitting: Internal Medicine

## 2017-09-12 NOTE — Telephone Encounter (Signed)
Fax received from the patient's pharmacy stating he will need a prior authorization for bisoprolol 5 mg- take 1/2 tablet by mouth once daily.  Called [facility] at [phone] to initiate a prior auth. Per [facility] this will take 24 hours to process by a pharmacist.   Call ref ID #: [id number] PA #: [id number]

## 2017-09-15 ENCOUNTER — Ambulatory Visit: Payer: Medicaid Other | Admitting: Internal Medicine

## 2017-09-15 NOTE — Telephone Encounter (Addendum)
I called to follow up with [facility] regarding a PA for bisoprolol. Per [facility] rep, the PA has been denied for bisoprolol as he has not tried and failed 2 drugs from the same class of preferred medications. Call ref #- [id number]  Will forward to Dr. Graciela Husbands to review.

## 2017-09-18 NOTE — Telephone Encounter (Signed)
Thanks

## 2017-09-22 ENCOUNTER — Ambulatory Visit: Payer: Medicaid Other | Admitting: Nurse Practitioner

## 2017-09-22 ENCOUNTER — Other Ambulatory Visit: Payer: Medicaid Other

## 2017-09-28 ENCOUNTER — Ambulatory Visit (INDEPENDENT_AMBULATORY_CARE_PROVIDER_SITE_OTHER): Payer: Medicaid Other | Admitting: Nurse Practitioner

## 2017-09-28 ENCOUNTER — Encounter: Payer: Self-pay | Admitting: Nurse Practitioner

## 2017-09-28 VITALS — BP 116/75 | HR 60 | Ht 72.0 in | Wt 183.5 lb

## 2017-09-28 DIAGNOSIS — R55 Syncope and collapse: Secondary | ICD-10-CM | POA: Diagnosis not present

## 2017-09-28 DIAGNOSIS — I421 Obstructive hypertrophic cardiomyopathy: Secondary | ICD-10-CM
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
## 2017-09-28 NOTE — Patient Instructions (Addendum)
Medication Instructions: - Your physician recommends that you continue on your current medications as directed. Please refer to the Current Medication list given to you today.  Labwork: - none ordered  Procedures/Testing: - none ordered  Follow-Up: - with Dr. Graciela Husbands as scheduled- Thursday 10/13/17 @ 9:45 am  Any Additional Special Instructions Will Be Listed Below (If Applicable).     If you need a refill on your cardiac medications before your next appointment, please call your pharmacy.

## 2017-09-29 ENCOUNTER — Ambulatory Visit: Payer: Medicaid Other | Admitting: Internal Medicine

## 2017-09-29 NOTE — Telephone Encounter (Signed)
Late entry- had reviewed the patient's medications further with Dr. Graciela Husbands. Per Dr. Graciela Husbands, the patient could take verapamil 120 mg once daily.  The patient was seen by Ward Givens, NP yesterday and was still taking metoprolol and tolerating this ok. He denied any diarrhea at this time with the medication.  I spoke with Dr. Graciela Husbands to inquire about next steps for the patient- per Dr. Graciela Husbands, he had spoken with Dr. Regino Schultze about the patient and they feel he will eventually come to having a myectomy, but the next step would be to implant an S-ICD in the patient.  The patient had already left the office when I spoke with Dr. Graciela Husbands, but he has follow up with Dr. Graciela Husbands on 10/13/17. Per Dr. Graciela Husbands, just have the patient keep his follow up at that time and then he will discuss S-ICD implant with him at that time.

## 2017-10-01 DIAGNOSIS — 419620001 Death: Secondary | SNOMED CT | POA: Insufficient documentation

## 2017-10-01 DEATH — deceased

## 2017-10-13 ENCOUNTER — Ambulatory Visit: Payer: Medicaid Other | Admitting: Internal Medicine

## 2017-10-27 ENCOUNTER — Ambulatory Visit (INDEPENDENT_AMBULATORY_CARE_PROVIDER_SITE_OTHER): Payer: Medicaid Other | Admitting: Internal Medicine

## 2017-10-27 ENCOUNTER — Encounter: Payer: Self-pay | Admitting: Internal Medicine

## 2017-10-27 VITALS — BP 102/72 | HR 58 | Ht 72.0 in | Wt 188.2 lb

## 2017-10-27 DIAGNOSIS — I421 Obstructive hypertrophic cardiomyopathy: Secondary | ICD-10-CM
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
## 2017-10-27 NOTE — Patient Instructions (Signed)
Medication Instructions: - Your physician recommends that you continue on your current medications as directed. Please refer to the Current Medication list given to you today.  Labwork: - none ordered  Procedures/Testing: - none ordered  Follow-Up: - pending your evaluation by Dr. Regino SchultzeWang  Any Additional Special Instructions Will Be Listed Below (If Applicable).     If you need a refill on your cardiac medications before your next appointment, please call your pharmacy.

## 2017-12-12 ENCOUNTER — Telehealth: Payer: Self-pay | Admitting: Internal Medicine

## 2017-12-12 NOTE — Telephone Encounter (Signed)
Patient calling States he has not yet heard from [facility] in regards to further evaluation Please call to discuss

## 2017-12-12 NOTE — Telephone Encounter (Signed)
Patient see by Dr. Graciela HusbandsKlein 10/27/17. He was to see Dr. Regino SchultzeWang and has not heard from his office.  Will review with Dr. Graciela HusbandsKlein.

## 2017-12-13 NOTE — Telephone Encounter (Signed)
Derek RodAndrew Wang said he would arrange to see thanks

## 2017-12-15 NOTE — Telephone Encounter (Signed)
I spoke with the patient to follow up regarding his appointment with Dr. Regino SchultzeWang. Per the patient, Dr. Juanda ChanceWang's office called him yesterday and he is scheduled to see him on 01/09/18.

## 2018-01-04 ENCOUNTER — Telehealth: Payer: Self-pay | Admitting: Internal Medicine

## 2018-01-04 MED ORDER — METOPROLOL TARTRATE 25 MG PO TABS: 25.0000 mg | ORAL_TABLET | Freq: Two times a day (BID) | ORAL | 6 refills | Status: AC

## 2018-01-04 NOTE — Telephone Encounter (Signed)
I called and spoke with the patient. I advised him Dr. Juanda Chance office was supposed to call him directly to arrange his appointment. Per the patient, he did talk with them and they were to send him an appointment calendar, but he has moved to [facility] so his address changed. I advised him that I did not know when his appointment was scheduled for, but gave him the # to Dr. Juanda Chance office to call and find out when his appointment is scheduled.  The patient was appreciative for the call back.

## 2018-01-04 NOTE — Telephone Encounter (Signed)
OK to refill Metoprolol.  RX sent to [facility] on [address].

## 2018-01-04 NOTE — Telephone Encounter (Signed)
Pt would like to know his appointment date and time for Dr. Regino Schultze

## 2018-01-04 NOTE — Telephone Encounter (Signed)
°*  STAT* If patient is at the pharmacy, call can be transferred to refill team.   1. Which medications need to be refilled? (please list name of each medication and dose if known) metoprolol   2. Which pharmacy/location (including street and city if local pharmacy) is medication to be sent to? [facility] [address] in [place]  3. Do they need a 30 day or 90 day supply? 90 day

## 2018-01-04 NOTE — Telephone Encounter (Signed)
Please advise if ok to refill Metoprolol due to pt f/u appointment pending upon Dr. Regino Schultze visit.

## 2018-01-05 ENCOUNTER — Telehealth: Payer: Self-pay | Admitting: Internal Medicine

## 2018-01-05 NOTE — Telephone Encounter (Signed)
Noted  

## 2018-01-05 NOTE — Telephone Encounter (Signed)
New Message   Derek Price with Dr. Rosanne Sack office called to advise that the referral has been accepted for consult only. The patient is scheduled for 09/30 at 8:00am.

## 2018-01-27 ENCOUNTER — Telehealth: Payer: Self-pay | Admitting: Internal Medicine

## 2018-01-27 NOTE — Telephone Encounter (Signed)
Echo CD sent Fed-Ex to Truitt Leep office @ [facility]. Attention: Renee Story.

## 2018-04-25 IMAGING — US US CAROTID DUPLEX BILAT
1 series · 13 of 24 positions shown · non-contrast
Comparison: None.

CLINICAL DATA: 32-year-old male with a history of syncope.

Cardiovascular risk factors include none listed
EXAM:
BILATERAL CAROTID DUPLEX ULTRASOUND
TECHNIQUE: Gray scale imaging, color Doppler and duplex ultrasound were
performed of bilateral carotid and vertebral arteries in the neck.

[Series 1: us carotid duplex bilat · 13 of 64 slices shown]
[im 1/64]
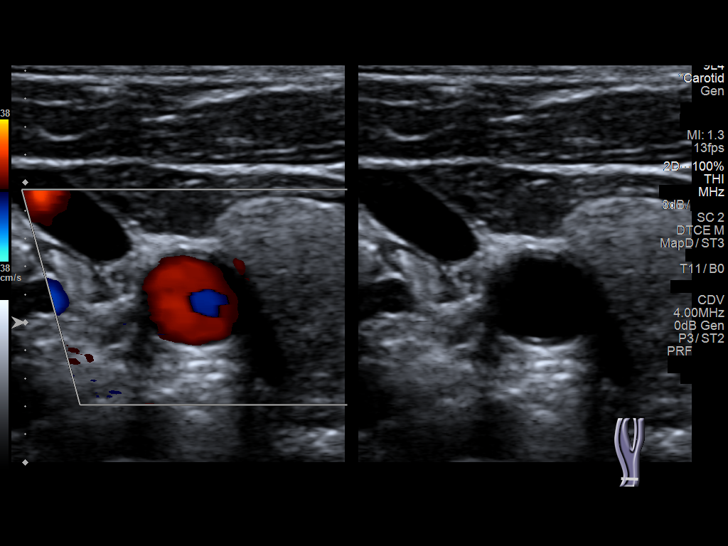
[im 6/64]
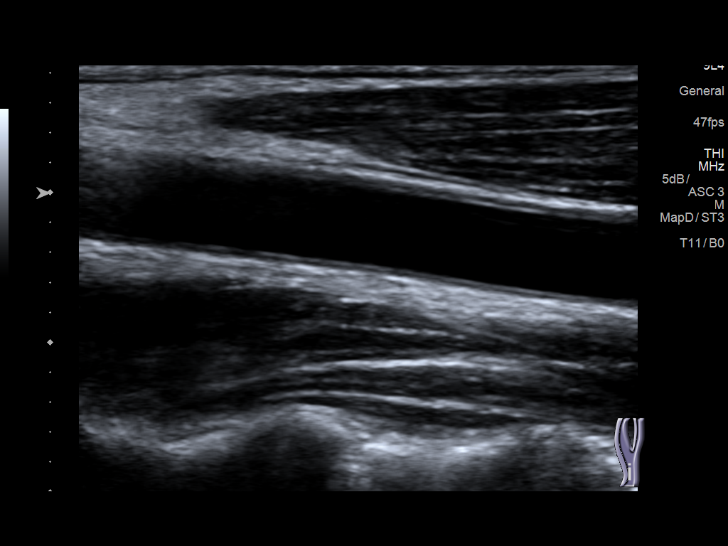
[im 11/64]
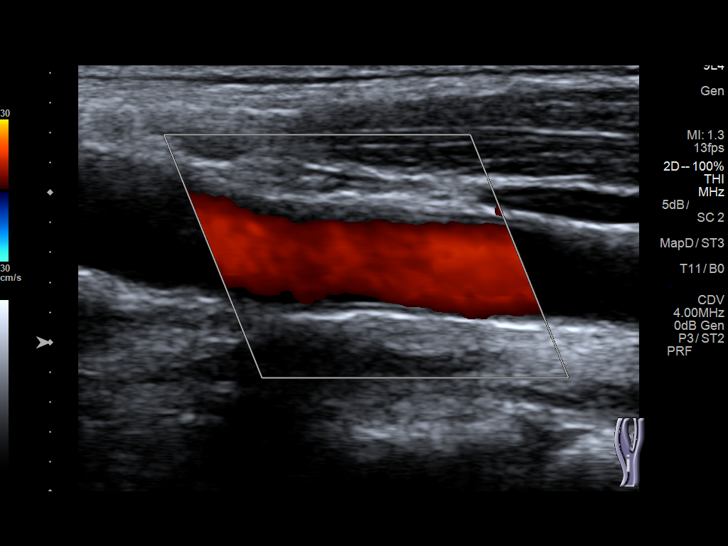
[im 17/64]
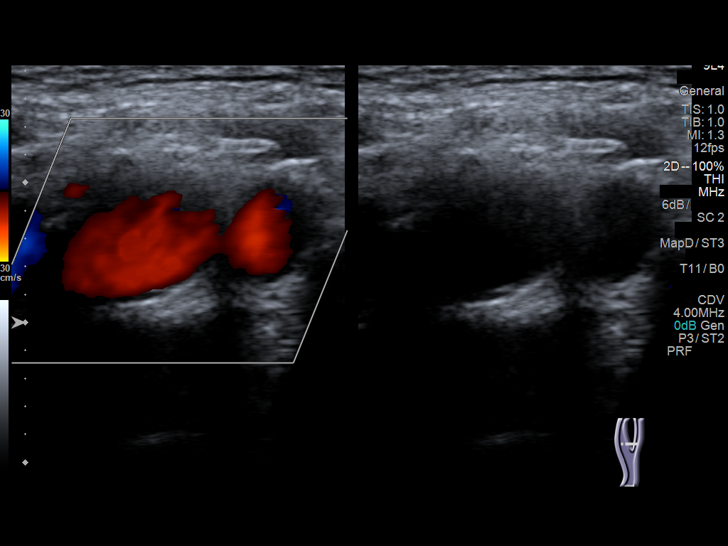
[im 22/64]
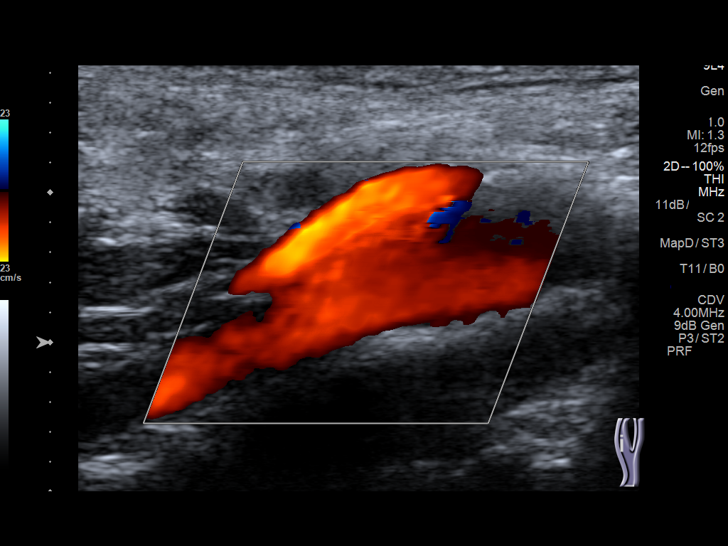
[im 28/64]
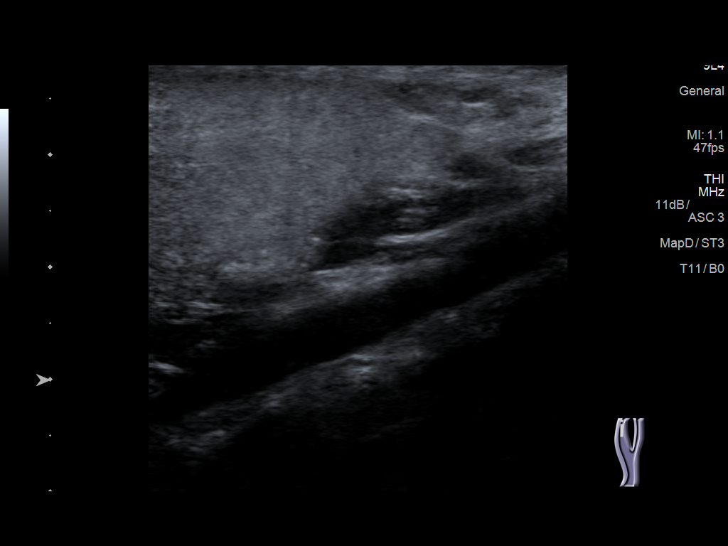
[im 33/64]
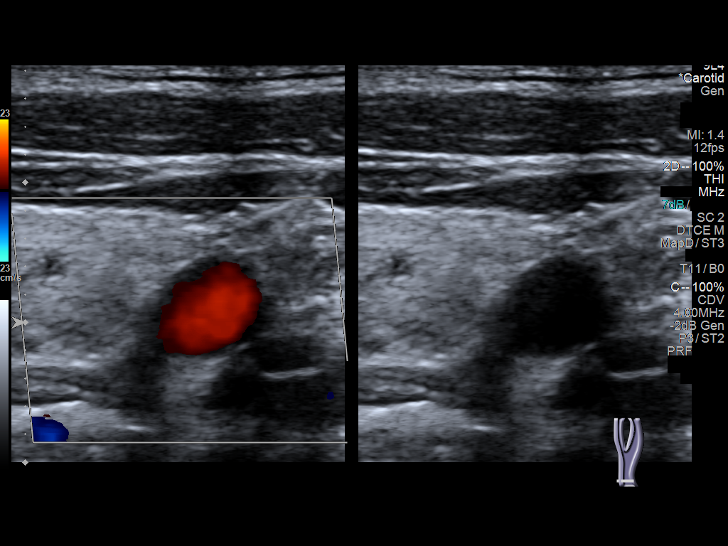
[im 36/64]
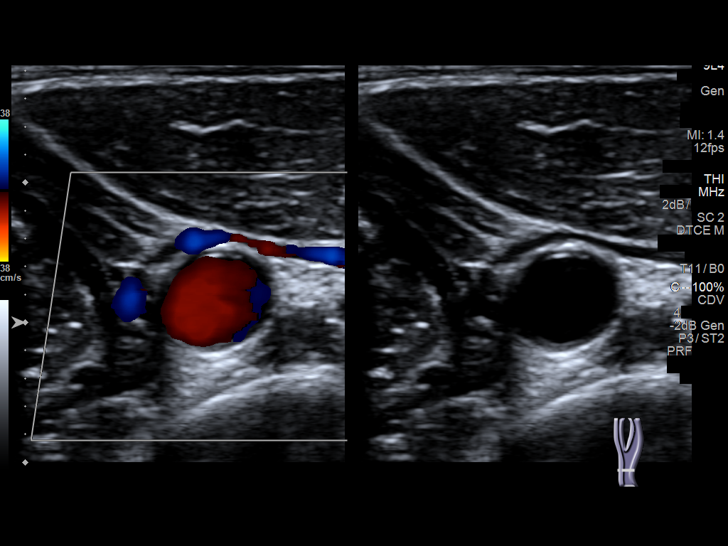
[im 42/64]
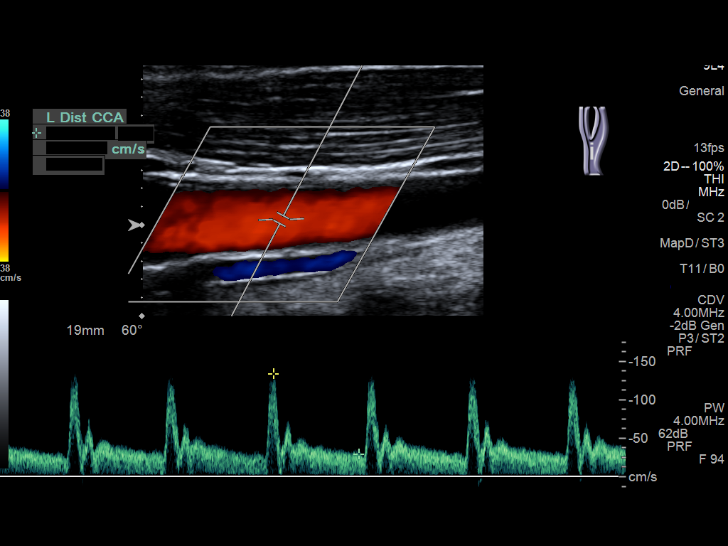
[im 47/64]
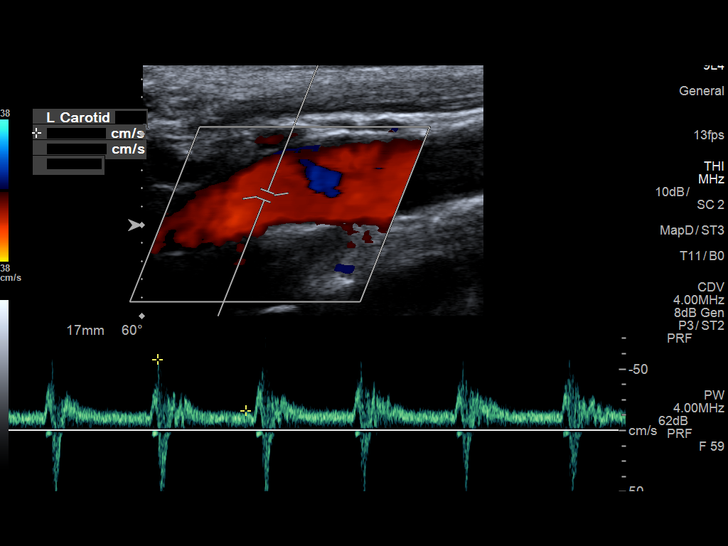
[im 53/64]
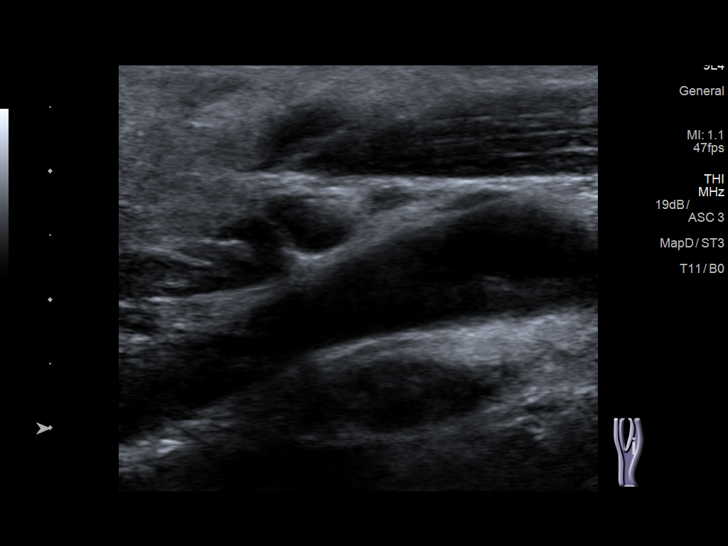
[im 58/64]
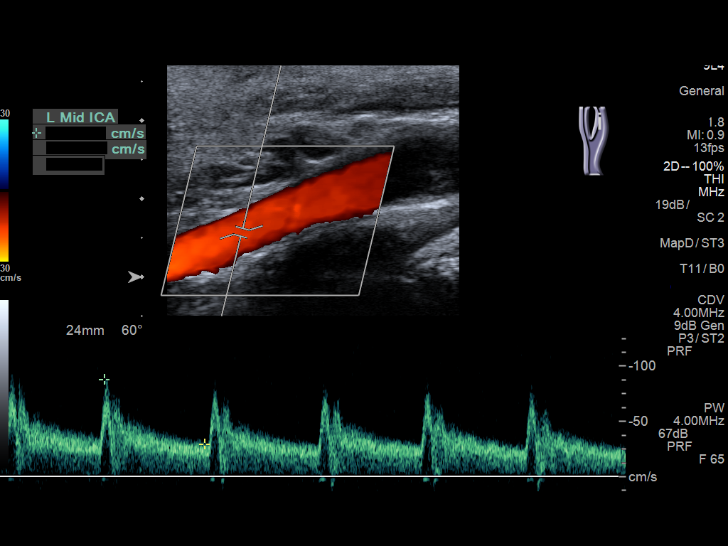
[im 64/64]
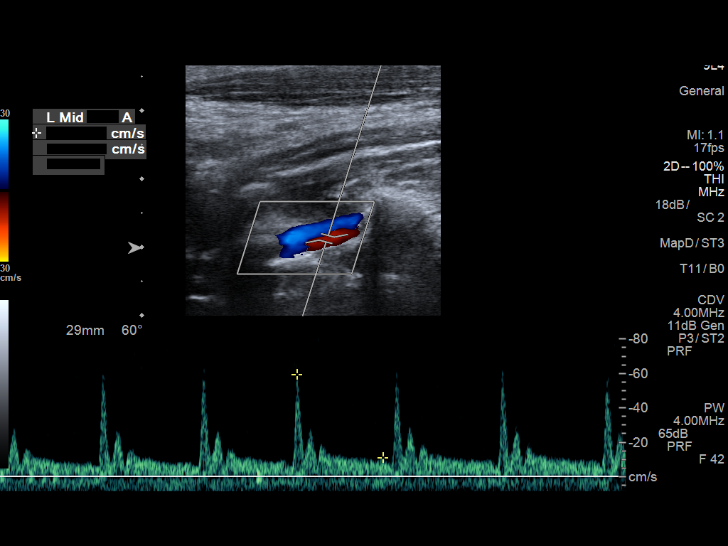

[13 of 24 positions shown; findings below may reference images not displayed]

FINDINGS: Criteria: Quantification of carotid stenosis is based on velocity
parameters that correlate the residual internal carotid diameter
with NASCET-based stenosis levels, using the diameter of the distal
internal carotid lumen as the denominator for stenosis measurement.

The following velocity measurements were obtained:

RIGHT

ICA:  Systolic 121 cm/sec, Diastolic 28 cm/sec

CCA:  193 cm/sec

SYSTOLIC ICA/CCA RATIO:

ECA:  90 cm/sec

LEFT

ICA:  Systolic 114 cm/sec, Diastolic 50 cm/sec

CCA:  185 cm/sec

SYSTOLIC ICA/CCA RATIO:

ECA:  111 cm/sec

Right Brachial SBP: Not acquired

Left Brachial SBP: Not acquired

RIGHT CAROTID ARTERY: No significant calcified disease of the right
common carotid artery. Intermediate waveform maintained.
Heterogeneous plaque without significant calcifications at the right
carotid bifurcation. Low resistance waveform of the right ICA. No
significant tortuosity.

RIGHT VERTEBRAL ARTERY: Antegrade flow with low resistance waveform.

LEFT CAROTID ARTERY: No significant calcified disease of the left
common carotid artery. Intermediate waveform maintained.
Heterogeneous plaque at the left carotid bifurcation without
significant calcifications. Low resistance waveform of the left ICA.

LEFT VERTEBRAL ARTERY:  Antegrade flow with low resistance waveform.
IMPRESSION: Color duplex indicates minimal heterogeneous plaque, with no
hemodynamically significant stenosis by duplex criteria in the
extracranial cerebrovascular circulation.
